# Patient Record
Sex: Male | Born: 1945 | Race: White | Hispanic: No | Smoking: Former smoker
Health system: Southern US, Community
[De-identification: ages and names within clinical notes are randomized; demographics above are authoritative.]

## PROBLEM LIST (undated history)

## (undated) DIAGNOSIS — I10 Essential (primary) hypertension: Secondary | ICD-10-CM

## (undated) DIAGNOSIS — C801 Malignant (primary) neoplasm, unspecified: Secondary | ICD-10-CM

## (undated) HISTORY — PX: LUNG TRANSPLANT: SHX234

---

## 2005-10-27 ENCOUNTER — Ambulatory Visit: Payer: Self-pay | Admitting: Internal Medicine

## 2005-10-29 ENCOUNTER — Ambulatory Visit: Payer: Self-pay | Admitting: Internal Medicine

## 2005-11-10 ENCOUNTER — Ambulatory Visit: Admission: RE | Admit: 2005-11-10 | Discharge: 2005-11-10 | Payer: Self-pay | Admitting: Internal Medicine

## 2005-11-10 ENCOUNTER — Ambulatory Visit: Payer: Self-pay | Admitting: Internal Medicine

## 2006-02-02 ENCOUNTER — Ambulatory Visit: Payer: Self-pay | Admitting: Internal Medicine

## 2006-05-26 ENCOUNTER — Ambulatory Visit: Payer: Self-pay | Admitting: Internal Medicine

## 2006-08-05 IMAGING — CT CT CHEST W/ CM
2 series · 15 of 32 positions shown, 18 images · non-contrast
Comparison: none

REASON FOR EXAM: Pneumonia
COMMENTS:

[Series 2: soft tissue · axial · 0.64mm/px · z∈[-367,-217]mm · 5 of 69 slices shown]
[im 9/69  mediastinal]
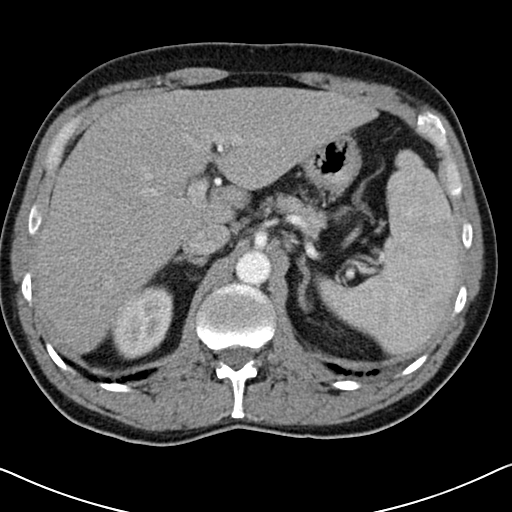
[im 18/69  mediastinal]
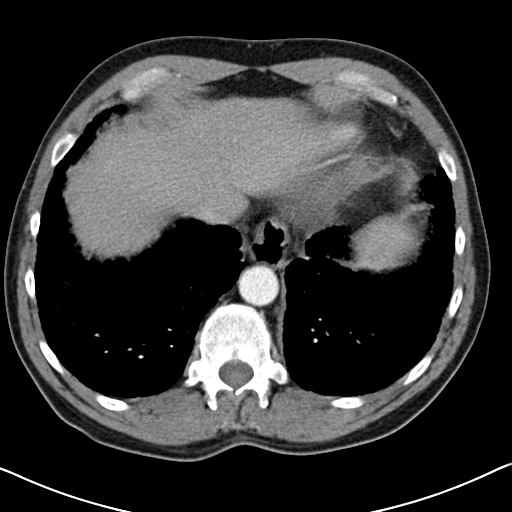
[im 23/69  mediastinal]
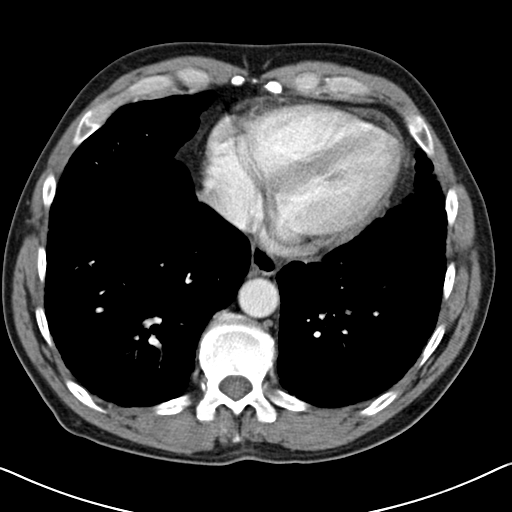
[im 30/69  mediastinal]
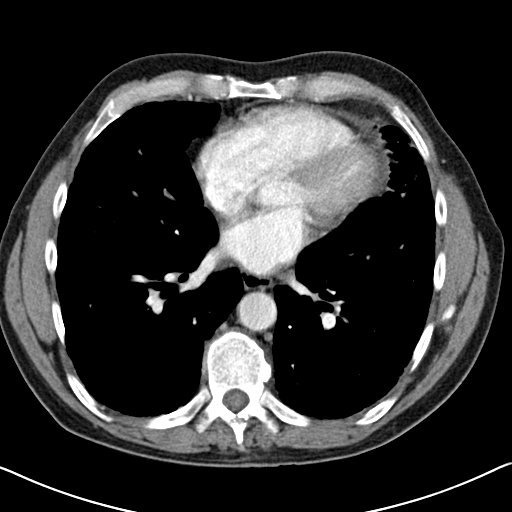
[im 39/69  mediastinal]
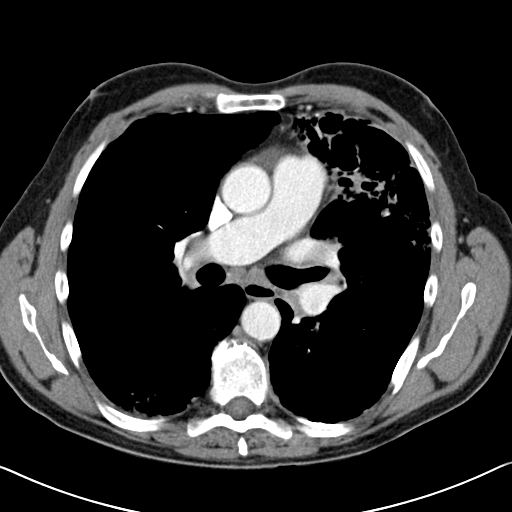

[Series 5: high res · axial · 0.64mm/px · z∈[-371,-107]mm · 10 of 43 slices shown, 13 images]
[im 5/43  mediastinal]
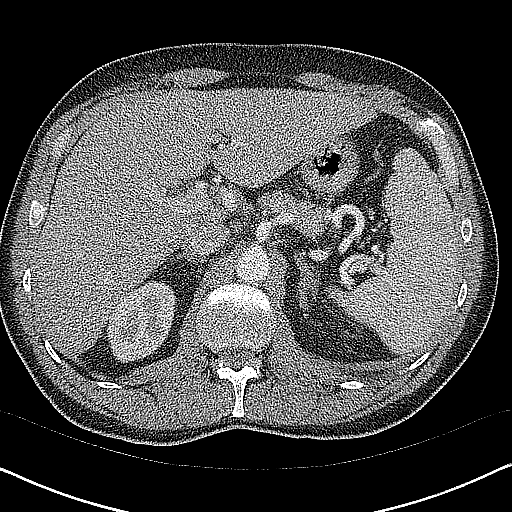
[im 5/43  lung]
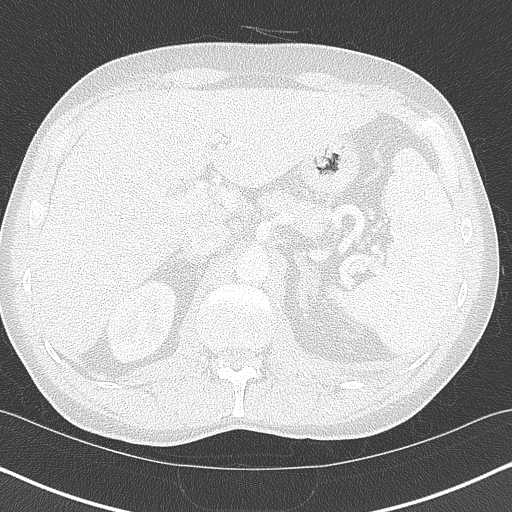
[im 9/43  lung]
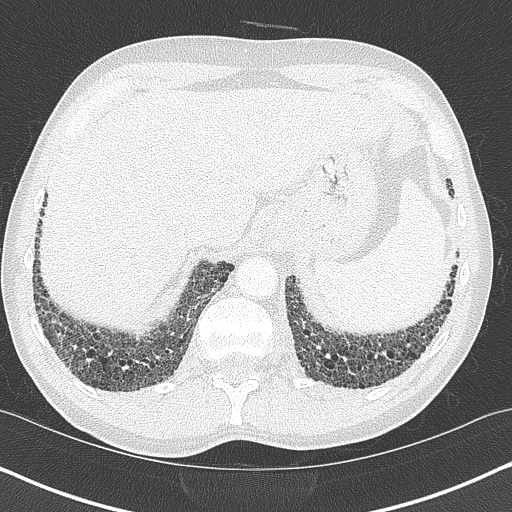
[im 13/43  lung]
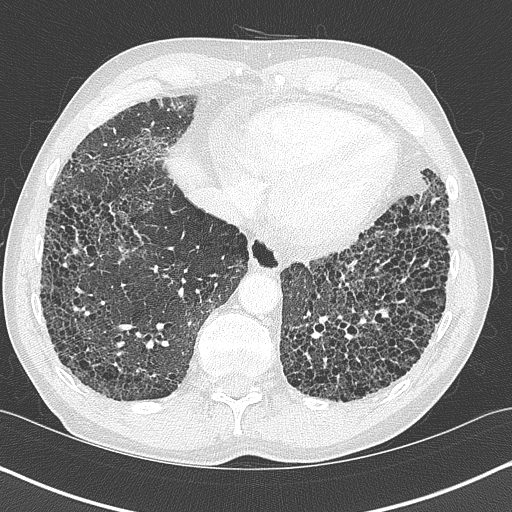
[im 17/43  lung]
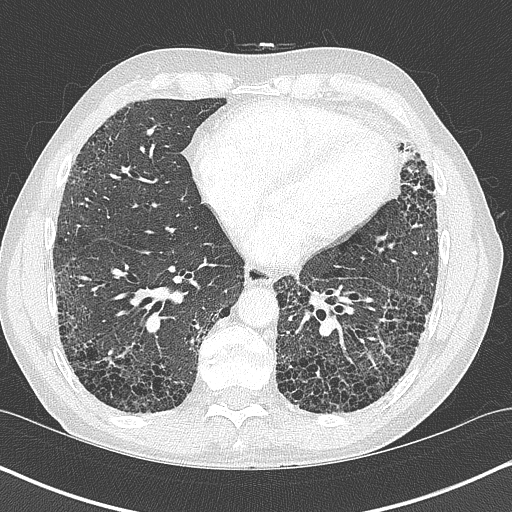
[im 21/43  mediastinal]
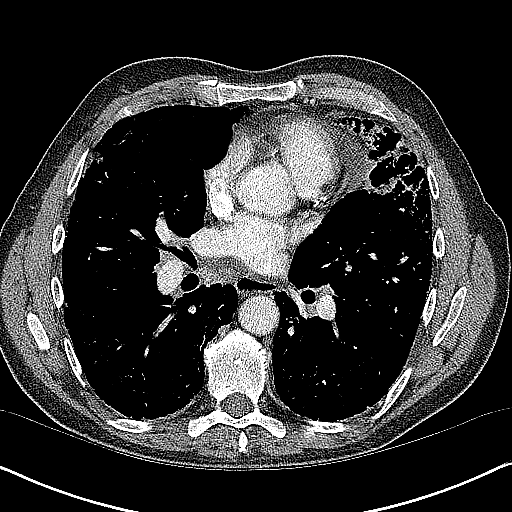
[im 21/43  lung]
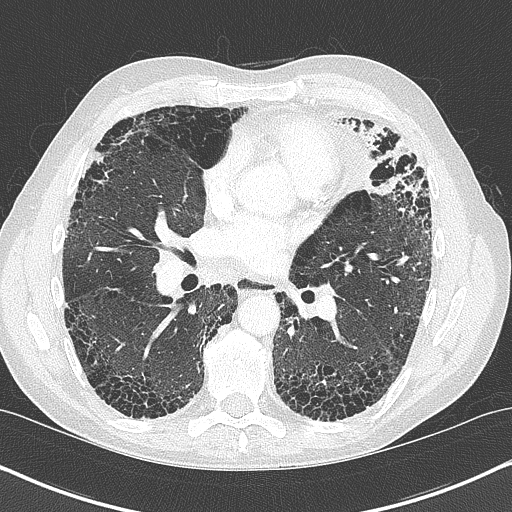
[im 22/43  lung]
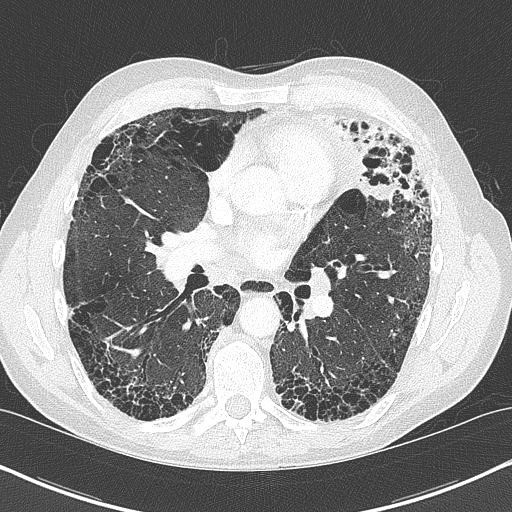
[im 26/43  lung]
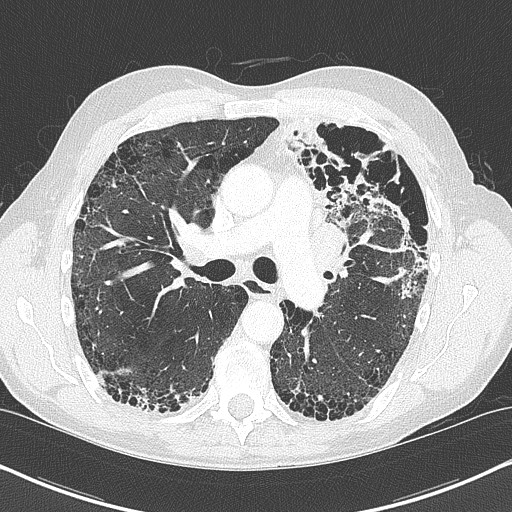
[im 30/43  lung]
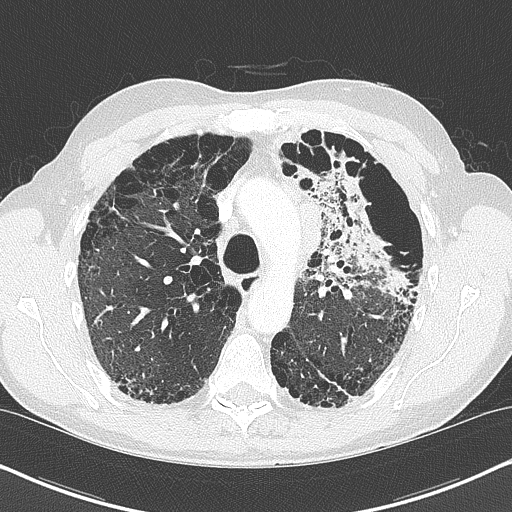
[im 34/43  mediastinal]
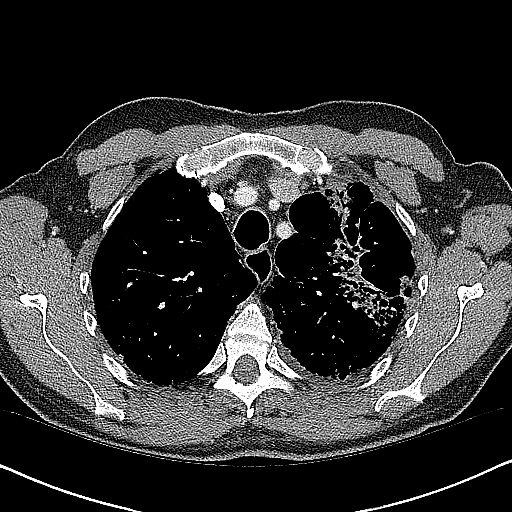
[im 34/43  lung]
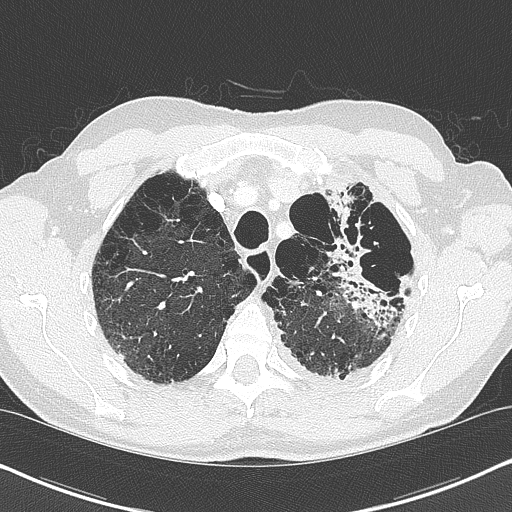
[im 38/43  lung]
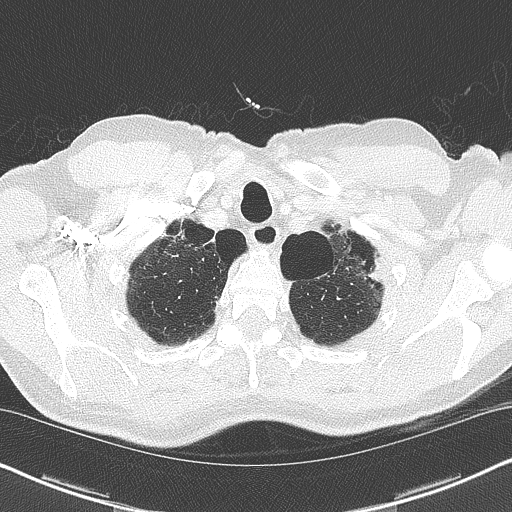

[15 of 32 positions shown; findings below may reference images not displayed]

PROCEDURE:     CT  - CT CHEST WITH CONTRAST  - October 27, 2005  [DATE]

RESULT:     The patient is being evaluated for pneumonia.

The patient received 75 ml of Ysovue-522 for this study.

At lung window settings, there is apical pleural scarring with large bullous
lesions bilaterally. In addition, increased interstitial density occupies
much of the anterior portion of the LEFT upper lobe. More inferiorly, the
LEFT upper lobe has less of the interstitial density. Subpleural
honeycombing of both lower lobes is present. The RIGHT middle lobe exhibits
mild emphysematous change. I do not see abnormal pulmonary masses. There is
some parenchymal density in a subpleural location posteriorly in the RIGHT
mid thorax.

Within the mediastinum there are enlarged lymph nodes in the AP window. The
largest of these measures approximately 3.5 cm AP x approximately 1.5 cm
transversely and has longitudinal dimension of approximately 4.0 cm. There
are smaller, adjacent nodes present. There is an enlarged hilar lymph node
on the LEFT measuring approximately 3.0 cm AP x approximately 1.8 cm
transversely x approximately 3.0 cm in superior to inferior dimension. There
is an enlarged LEFT hilar lymph node present measuring approximately 2.5 x
2.0 cm. There is no bulky subcarinal lymphadenopathy. The caliber of the
thoracic aorta is normal. The cardiac chambers are not enlarged. There is a
small, pericardial effusion. I see no significant pleural effusion. The
retrosternal soft tissues are normal in appearance. I see no axillary
lymphadenopathy.

Within the upper abdomen, the observed portions of the liver and spleen
exhibit no acute abnormality. There is a hiatal hernia present. The adrenal
glands are not enlarged.
IMPRESSION: 1.     There is extensive bullous emphysematous change of both lungs. In
addition, there is subpleural honeycombing present in both lungs especially
in the lower lobes. There is an area of confluent interstitial density in
the LEFT upper lobe which is suspicious for an active process, either
inflammatory or neoplastic. A discrete abnormal mass within the parenchyma
is not identified. There are abnormally enlarged mediastinal and hilar lymph
nodes present.
2.     There is a small, pericardial effusion but I do not see evidence of
cardiac chamber dilation. There is no pleural effusion.
3.     There is a hiatal hernia present.

## 2006-12-01 ENCOUNTER — Ambulatory Visit: Payer: Self-pay | Admitting: Internal Medicine

## 2007-01-12 ENCOUNTER — Encounter: Payer: Self-pay | Admitting: Internal Medicine

## 2007-01-20 ENCOUNTER — Ambulatory Visit: Payer: Self-pay | Admitting: Internal Medicine

## 2007-03-22 ENCOUNTER — Encounter (HOSPITAL_COMMUNITY): Admission: RE | Admit: 2007-03-22 | Discharge: 2007-06-20 | Payer: Self-pay | Admitting: Internal Medicine

## 2007-04-28 ENCOUNTER — Ambulatory Visit: Payer: Self-pay | Admitting: Internal Medicine

## 2007-05-02 ENCOUNTER — Ambulatory Visit: Payer: Self-pay | Admitting: Pulmonary Disease

## 2007-06-21 ENCOUNTER — Encounter (HOSPITAL_COMMUNITY): Admission: RE | Admit: 2007-06-21 | Discharge: 2007-09-19 | Payer: Self-pay | Admitting: Internal Medicine

## 2007-09-16 ENCOUNTER — Ambulatory Visit: Payer: Self-pay | Admitting: Internal Medicine

## 2007-09-21 ENCOUNTER — Encounter (HOSPITAL_COMMUNITY): Admission: RE | Admit: 2007-09-21 | Discharge: 2007-09-22 | Payer: Self-pay | Admitting: Internal Medicine

## 2007-11-21 ENCOUNTER — Telehealth (INDEPENDENT_AMBULATORY_CARE_PROVIDER_SITE_OTHER): Payer: Self-pay | Admitting: *Deleted

## 2007-12-16 ENCOUNTER — Telehealth (INDEPENDENT_AMBULATORY_CARE_PROVIDER_SITE_OTHER): Payer: Self-pay | Admitting: *Deleted

## 2007-12-20 ENCOUNTER — Encounter: Payer: Self-pay | Admitting: Internal Medicine

## 2008-01-10 ENCOUNTER — Telehealth: Payer: Self-pay | Admitting: Internal Medicine

## 2008-03-21 ENCOUNTER — Encounter: Payer: Self-pay | Admitting: Internal Medicine

## 2008-04-12 ENCOUNTER — Encounter: Payer: Self-pay | Admitting: Internal Medicine

## 2008-05-16 ENCOUNTER — Encounter: Payer: Self-pay | Admitting: Internal Medicine

## 2008-07-05 ENCOUNTER — Encounter: Payer: Self-pay | Admitting: Internal Medicine

## 2008-08-20 ENCOUNTER — Encounter: Payer: Self-pay | Admitting: Internal Medicine

## 2009-09-03 ENCOUNTER — Encounter: Payer: Self-pay | Admitting: Internal Medicine

## 2010-09-08 ENCOUNTER — Encounter: Payer: Self-pay | Admitting: Internal Medicine

## 2011-01-20 NOTE — Letter (Signed)
Summary: Transplant Return Visit/[facility]  Transplant Return Visit/[facility]   Imported By: Sherian Rein 09/25/2010 09:59:20  _____________________________________________________________________  External Attachment:    Type:   Image     Comment:   External Document

## 2011-02-16 ENCOUNTER — Encounter: Payer: Self-pay | Admitting: Internal Medicine

## 2011-03-10 NOTE — Letter (Signed)
Summary: Transplant visit/[facility]  Transplant visit/[facility]   Imported By: Lester Superior 03/03/2011 08:26:04  _____________________________________________________________________  External Attachment:    Type:   Image     Comment:   External Document

## 2011-05-05 NOTE — Assessment & Plan Note (Signed)
[name]                             PULMONARY OFFICE NOTE   Billy Mckenzie, Billy Mckenzie                      MRN:          [id number]  DATE:05/02/2007                            DOB:          12/31/1945    HISTORY OF PRESENT ILLNESS:  The patient is a 65 year old white male  patient of Dr.  Thurston Hole who has a known history of chronic hypoxic  respiratory failure secondary to COPD and pulmonary fibrosis that  presents today for an acute office visit. The patient complains over the  past week he has had waxing and waning chills and fever with slight  increase in cough and congestion. The patient complains that he has had  progressively worsening shortness of breath with minimum activity. He  denies any hemoptysis, orthopnea, PND or leg swelling.   PAST MEDICAL HISTORY:  Reviewed.   CURRENT MEDICATIONS:  Reviewed.   PHYSICAL EXAMINATION:  The patient is a pleasant, thin, white male in no  acute distress. He is afebrile with stable vital signs. O2 saturation is  95% on 6 liters.  HEENT: Is unremarkable.  NECK: Supple without cervical adenopathy. No JVD.  LUNGS: Lung sounds revealed diminished breath sounds in the bases  without any wheezing, crackles.  CARDIAC: Regular rate.  ABDOMEN: Soft and nontender.  EXTREMITIES: Warm without any calf cyanosis, clubbing or edema.   IMPRESSION/PLAN:  Mild chronic obstructive pulmonary disease flare. The  patient is to begin doxycycline 100 mg b.i.d. x7 days. Add in Mucinex  twice daily. The patient will return back with Dr.  Sherene Sires as scheduled or  sooner if needed.      Rubye Oaks, NP       [name]. Sherene Sires, MD, [facility]    TP/MedQ  DD: 05/02/2007  DT: 05/02/2007  Job #: [id number]

## 2011-05-05 NOTE — Assessment & Plan Note (Signed)
[facility]                             PULMONARY OFFICE NOTE   LONZA, SHIMABUKURO                      MRN:          [id number]  DATE:09/16/2007                            DOB:          [birth year]    HISTORY:  This is a 65 year old white male with documented pulmonary  fibrosis associated with honeycombing dating back to at least November  2006 who is now oxygen dependent and no longer able to work. He is being  followed at [facility], but comes in today acutely for increasing nasal  congestion associated with dyspnea while on oxygen up to 6 liters per  minute. He does have minimum cough productive of thick mucus and nasal  congestion, but no fevers, chills, sweats, orthopnea, PND, purulent  sputum, leg swelling, or chest pain.   His medications at this point consist of:  1. Spiriva 1 daily.  2. Aciphex 200 mg q.a.m.   He has found on his own that using Afrin helps transiently.   PHYSICAL EXAMINATION:  GENERAL:  He has a mild nasal tone to his voice.  VITAL SIGNS:  Stable.  HEENT:  Remarkable for moderate non-specific turbinate edema, oropharynx  is clear.  NECK:  Supple without cervical adenopathy or tenderness. Trachea is  midline.  LUNGS:  Fields reveal coarse inspiratory crackles in the bases only.  There is no expiratory wheeze or cough on inspiration.  CARDIAC:  Regular rate and rhythm with no murmurs, rubs, or gallops. No  increase in P2.  ABDOMEN:  Soft and benign.  EXTREMITIES:  Warm without calf tenderness, cyanosis or edema. There is  moderate clubbing.   Hemoglobin saturation is at 95% on 6 liters.   IMPRESSION:  Chronic rhinitis has developed I believe due to the  irritation of using nasal oxygen and maybe blocking the administration  of oxygen in this chronically O2 dependent patient. I have recommended a  trial of Nasonex in combination with Afrin, and I spent extra time  reviewing a chronic rhinitis flyer to him emphasizing  that if the  nasal inflammation is not addressed, he will not be able to get the full  benefit of wearing oxygen, which is really all we have to offer given  the severity of the honeycomb fibrosis that he is experiencing.   I reviewed all of these issues with him in detail in writing and  referred him back to [facility] for further follow up.     [name]. Sherene Sires, MD, [facility]  Electronically Signed    MBW/MedQ  DD: 09/16/2007  DT: 09/17/2007  Job #: [id number]   cc:   Daniel Nones, MD

## 2011-05-05 NOTE — Assessment & Plan Note (Signed)
[facility]                             PULMONARY OFFICE NOTE   Billy Mckenzie, Billy Mckenzie                      MRN:          [id number]  DATE:04/28/2007                            DOB:          26-Jan-1946    PRIMARY PULMONARY/EXTENDED FOLLOWUP OFFICE VISIT:   HISTORY:  [age]-year-old white male with severe pulmonary  fibrosis, now being considered for a lung transplantation at [facility] and  with chronic hypoxemic respiratory failure, which appears  disproportionate to lung volume and has left him on oxygen, 3 L at rest  and 6 L with activity.  He states that on 6 L he can walk 1.7 miles an  hour for 30 minutes.  There is concern that he desaturates below 89% and  whether or not he should be using a face mask; however, the patient has  no symptoms exertional chest pain, chest tightness, diaphoresis, nausea  or altered mentation related to pushing himself to saturations as low as  77% (although he tells me they typically stop him when he gets below  88%).   He did have 1 episode of a chill a week ago, but no purulent sputum,  pleuritic pain, fevers, chills sweats, orthopnea, PND or leg swelling.   MEDICATIONS:  His only medications at this point are:  1. Spiriva 1 daily.  2. AcipHex 20 mg one q.a.m.   PHYSICAL EXAMINATION:  He is a pleasant, ambulatory white male in no  acute distress.  He has stable vital signs.  HEENT:  Unremarkable.  Oropharynx clear.  NECK:  Supple without cervical adenopathy or tenderness.  Trachea is  midline with no thyromegaly.  LUNGS:  Fields reveal decreased breath sounds bilaterally with no  wheezing.  HEART:  Regular rhythm without murmur, gallop or rub.  ABDOMEN:  Soft and benign.  EXTREMITIES:  Warm without calf tenderness, cyanosis or edema.  There  was moderate clubbing.   He walked from our lobby into the exam room on 3 L (note -- he is  supposed to be on 6 L when he walks) and desaturated to 77%, but felt  fine.  At rest, he increased back up to 90%.   IMPRESSION:  Severe hypoxic respiratory failure secondary to pulmonary  fibrosis.  He really does not have much in terms of COPD, but  benefitted clinically from Spiriva, which I have asked him to continue.   Further, because of the association between fibrosis and reflux  (although not necessarily cause and effect), I think it is prudent to  continue AcipHex before breakfast daily.   He had a chill last week, but nothing to suggest ongoing  tracheobronchitis or infection of any kind; therefore, I gave him some  symptoms to be on the lookout for that would indicate infection.  He is  susceptible to infection, but also would definitely likely decompensate  with viral infection and/or is subject to acute exacerbation of  interstitial lung disease which is often indistinguishable from other  causes of respiratory decompensation including viral infections.   We had a long and philosophical discussion about the issues related to  rehabilitation  and transplant.  I strongly would recommend this patient  proceed to transplant, based on the severity of his disease, and would  avoid using anti-inflammatories here because he really does not have  much to indicate an inflammatory process.   Finally, in terms of how much oxygen he needs with exercise, I think it  is reasonable if he feels fine on 6 L to continue to exercise at 6 L.  I  would be concerned if he drops below 85% consistently and only at that  point would I recommend using a face mask, and only with oxygen.   I will see the patient back here on an as-needed basis.  He has regular  followup at [facility] with Dr. Christin Fudge.     [name]. Sherene Sires, MD, [facility]  Electronically Signed    MBW/MedQ  DD: 04/28/2007  DT: 04/28/2007  Job #: [id number]   cc:   Almon Register, [place], [place] Daniel Nones MD  [facility] Mike Craze MD

## 2011-05-08 NOTE — Assessment & Plan Note (Signed)
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  He returns today much worse over the  last 5 days with increasing sensation of cough and congestion after  developing a shaking chill on January 27.  He denies any purulent  sputum, however, sore throat, recurrent fever, pruritic pain.  He is  having more dyspnea than usual, however.   He is maintaining on Spiriva 1 daily and AcipHex q. a.m. empirically.   PHYSICAL EXAMINATION:  He is a robust, pleasant, ambulatory white male  in no acute distress.  VITAL SIGNS:  Stable vital signs.  HEENT:  Unremarkable.  Oropharynx is clear.  LUNGS:  Fields reveal minimal crackles at the bases only.  CARDIAC:  Regular rate and rhythm without murmur, gallop, or rub.  ABDOMEN:  Soft and benign.  EXTREMITIES:  Warm without calf tenderness, cyanosis, or edema, though  there is moderate to severe clubbing.  NEUROLOGIC:  No focal deficits.   Hemoglobin saturation is only 73% on room air, but on 4L increased to  the high 90s.  On 2L, he was in the low 90s at rest.   IMPRESSION:  Acute on chronic hypoxemic respiratory failure, much worse  after apparent upper respiratory infection.  I note that he received  Pneumovax in 2004, but did not receive a flu vaccination this fall.  However, he has had no recurrent fever, myalgias, or arthralgias to  suggest an ongoing flu syndrome, and I suspect that this was viral but  not influenza.   RECOMMENDATIONS:  Extended discussion with this patient lasting 15-20  minutes of a 25 minute visit on the following topics.  1. Initiate Doxycycline 100 mg b.i.d. for 10 days empirically.  2. Oxygen at 2L 24 hours a day.  3. Continue AcipHex 20 mg before meals daily, not because I think      reflux is causing the problem, but may be a secondary phenomenon      given the severity of the fibrosis.  4. Also continue Spiriva once daily for possible element of air flow      obstruction, although I do not think this is a major issue either.      I went over each and every 1 of these issues with the patient,      including the need to consider going on disability.  He has at      least agreed to take some time off and see if he can return to work      on February 4 as his job as  a Psychologist, sport and exercise, and promises to try to wear portable oxygen (though I did      not get a firm commitment in this regard today).     [name]. Sherene Sires, MD, [facility]  Electronically Signed    MBW/MedQ  DD: 01/20/2007  DT: 01/20/2007  Job #: [id number]   cc:   Ronney Lion, Dr

## 2011-05-08 NOTE — Letter (Signed)
November 10, 2007     RE:  KANOA, PHILLIPPI  MRN:  [id number]  /  DOB:  1946-11-18   To Whom It May Concern:   Mr. Shemar Plemmons has been a patient of mine since October 29, 2005.  He suffers from progressive pulmonary fibrosis to the point of resting  hypoxemic respiratory failure and is severely debilitated and oxygen  dependent.  On this basis, since January 20, 2007, he has not been able  to work and has been under the care as well of the Pulmonary Division at  [facility].    Sincerely,      Casimiro Needle B. Sherene Sires, MD, [facility]  Electronically Signed    MBW/MedQ  DD: 11/10/2007  DT: 11/10/2007  Job #: [id number]

## 2011-05-08 NOTE — Assessment & Plan Note (Signed)
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  He was first diagnosed by Dr. Deboraha Sprang in  2004, and continues to have minimum symptoms.  He is able to ambulate  all day long on the floor at [facility] without difficulty,  walking up to 10 miles a day.  He says he is making more money than he  ever has, and really does not want to consider stopping.  He returns as  requested for followup, having had significant benefit in his main  complaint, cough, taking Aciphex daily and also observing a reflux diet.  He also has been maintained on Spiriva.   PHYSICAL EXAMINATION:  He is a pleasant ambulatory white male in no  acute distress.  He is afebrile, stable vital signs.  HEENT:  Unremarkable, oropharynx is clear.  Lung fields classic crackles in the bases only.  Breath sounds are  slightly coarse.  There is no wheezing.  CARDIAC:  There is a regular rhythm without murmur, gallop or rub.  ABDOMEN:  Soft, benign.  EXTREMITIES:  Warm without calf tenderness, cyanosis or edema.  There is  severe clubbing bilaterally.   Hemoglobin saturation is 83% on room air.   Chest x-ray reveals coarse fibrotic change bilaterally with relatively  preserved lung volumes.  PFTs reveal an FVC of 85% predicted, but a  diffusion capacity of only 20%.   IMPRESSION:  Extended discussion with this patient lasting 15 to 20  minutes of a 25 minute visit on the following topics:   Classic pulmonary fibrosis.  The patient tells me he was diagnosed with  rheumatism by Dr. Gavin Potters years ago, and refused to take therapy.  However, he has had no  significant myalgias or arthralgias and I suspect  that these were false positives in a patient who has usual interstitial  pneumonia, which is reported to show abnormal serology for rheumatologic  diseases in up to 30% of cases.   In any case, regardless of the mechanism driving the fibrosis, he has  reached end-stage where he is hypoxemic at rest, and needs to be  considered for lung transplant surgery.  I have recommended that he be  referred now, although he is strongly reluctant to do this, especially  since he feels better from the combination of Spiriva and Aciphex,  which was started in this office.   I spent extra time with this patient going over the natural history of  the disease and the complications, which include polycythemia with risk  of stroke and heart disease, and the fact that anti-inflammatories were  unlikely to be of any benefit unless there is significant elevation of  sed rate (which was drawn along with repeat serologies for rheumatologic  disease today).  Once he does establish with [facility], his followup here can be p.r.n.  The  patient declined again today to be considered for ambulatory oxygen.     Billy Mckenzie. Sherene Sires, MD, [facility]  Electronically Signed    MBW/MedQ  DD: 12/01/2006  DT: 12/02/2006  Job #: [id number]   cc:   Daniel Nones, MD

## 2016-08-01 ENCOUNTER — Emergency Department
Admission: EM | Admit: 2016-08-01 | Discharge: 2016-08-01 | Disposition: A | Discharge status: Home / Self Care | Payer: Medicare Other | Attending: Student in an Organized Health Care Education/Training Program | Admitting: Student in an Organized Health Care Education/Training Program

## 2016-08-01 ENCOUNTER — Encounter: Payer: Self-pay | Admitting: Emergency Medicine

## 2016-08-01 DIAGNOSIS — Y999 Unspecified external cause status: Secondary | ICD-10-CM | POA: Diagnosis not present

## 2016-08-01 DIAGNOSIS — S51811A Laceration without foreign body of right forearm, initial encounter: Secondary | ICD-10-CM | POA: Insufficient documentation

## 2016-08-01 DIAGNOSIS — I10 Essential (primary) hypertension: Secondary | ICD-10-CM | POA: Insufficient documentation

## 2016-08-01 DIAGNOSIS — Z87891 Personal history of nicotine dependence: Secondary | ICD-10-CM | POA: Insufficient documentation

## 2016-08-01 DIAGNOSIS — Y9389 Activity, other specified: Secondary | ICD-10-CM | POA: Diagnosis not present

## 2016-08-01 DIAGNOSIS — Y929 Unspecified place or not applicable: Secondary | ICD-10-CM | POA: Insufficient documentation

## 2016-08-01 DIAGNOSIS — W268XXA Contact with other sharp object(s), not elsewhere classified, initial encounter: Secondary | ICD-10-CM | POA: Insufficient documentation

## 2016-08-01 HISTORY — DX: Essential (primary) hypertension: I10

## 2016-08-01 MED ORDER — LIDOCAINE-EPINEPHRINE (PF) 1 %-1:200000 IJ SOLN
10.0000 mL | Freq: Once | INTRAMUSCULAR | Status: AC
  Administered 2016-08-01: 10 mL
  Filled 2016-08-01: qty 30

## 2016-08-01 MED ORDER — AMOXICILLIN-POT CLAVULANATE 875-125 MG PO TABS: 1.0000 | ORAL_TABLET | Freq: Two times a day (BID) | ORAL | 0 refills | Status: DC

## 2016-08-01 NOTE — ED Provider Notes (Signed)
[facility] Emergency Department Provider Note  ____________________________________________  Time seen: Approximately 5:35 PM  I have reviewed the triage vital signs and the nursing notes.   HISTORY  Chief Complaint Laceration    HPI Billy Mckenzie is a 70 y.o. male who presents emergency department complaining of a laceration to his right forearm. Patient was using a table saw when the wooden table like he was cutting "bounced back" and caught him in the forearm. Patient reports that the would "pealed" the skin up. Patient reports that wouldn't in that struck him was smooth, polished, with no splinters. Patient immediately cleansed the wound, put a pressure dressing on it with good cessation of bleeding. Patient reports good range of motion to elbow, wrist, and all digits right hand. He denies any other injuries. 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   Past Medical History:  Diagnosis Date  . Hypertension     There are no active problems to display for this patient.   Past Surgical History:  Procedure Laterality Date  . LUNG TRANSPLANT      Prior to Admission medications   Medication Sig Start Date End Date Taking? Authorizing Provider  amoxicillin-clavulanate (AUGMENTIN) 875-125 MG tablet Take 1 tablet by mouth 2 (two) times daily. 08/01/16   Charline Bills Alishah Schulte, PA-C    Allergies Nsaids  No family history on file.  Social History Social History  Substance Use Topics  . Smoking status: Former Research scientist (life sciences)  . Smokeless tobacco: Not on file  . Alcohol use No     Review of Systems  Constitutional: No fever/chills Eyes: No visual changes.  Cardiovascular: no chest pain. Respiratory: no cough. No SOB. Musculoskeletal: Negative for musculoskeletal pain. Skin: Positive for laceration to the right forearm Neurological: Negative for headaches, focal weakness or numbness. 10-point ROS otherwise  negative.  ____________________________________________   PHYSICAL EXAM:  VITAL SIGNS: ED Triage Vitals  Enc Vitals Group     BP 08/01/16 1650 (!) 147/86     Pulse Rate 08/01/16 1650 87     Resp 08/01/16 1650 18     Temp 08/01/16 1650 97.9 F (36.6 C)     Temp Source 08/01/16 1650 Oral     SpO2 08/01/16 1650 98 %     Weight 08/01/16 1651 217 lb (98.4 kg)     Height 08/01/16 1651 5\' 11"  (1.803 m)     Head Circumference --      Peak Flow --      Pain Score 08/01/16 1652 2     Pain Loc --      Pain Edu? --      Excl. in [place]? --      Constitutional: Alert and oriented. Well appearing and in no acute distress. Eyes: Conjunctivae are normal. PERRL. EOMI. Head: Atraumatic. Cardiovascular: Normal rate, regular rhythm. Normal S1 and S2.  Good peripheral circulation. Respiratory: Normal respiratory effort without tachypnea or retractions. Lungs CTAB. Good air entry to the bases with no decreased or absent breath sounds. Musculoskeletal: Full range of motion to all extremities. No gross deformities appreciated. Neurologic:  Normal speech and language. No gross focal neurologic deficits are appreciated.  Skin:  Skin is warm, dry and intact. No rash noted. Flap laceration is noted to the right anterior forearm. Lacerated edge measures approximately 10 cm in length. Visualization of the entire wound surface is obtained by lifting flat. No visible foreign body. Bleeding is controlled at this time. Flexor tendon is  visible upon inspection. Patient has full range of motion to wrist and all digits right hand. No visible injury to tendon. Psychiatric: Mood and affect are normal. Speech and behavior are normal. Patient exhibits appropriate insight and judgement.   ____________________________________________   LABS (all labs ordered are listed, but only abnormal results are displayed)  Labs Reviewed - No data to  display ____________________________________________  EKG   ____________________________________________  RADIOLOGY   No results found.  ____________________________________________    PROCEDURES  Procedure(s) performed:    Marland KitchenMarland KitchenLaceration Repair Date/Time: 08/01/2016 6:51 PM Performed by: Betha Loa D Authorized by: Betha Loa D   Consent:    Consent obtained:  Verbal   Consent given by:  Patient   Risks discussed:  Pain, tendon damage and poor wound healing   Alternatives discussed:  Referral and delayed treatment Anesthesia (see MAR for exact dosages):    Anesthesia method:  Local infiltration   Local anesthetic:  Lidocaine 1% WITH epi (1ml) Laceration details:    Location:  Shoulder/arm   Shoulder/arm location:  R lower arm   Length (cm):  10 Repair type:    Repair type:  Intermediate Pre-procedure details:    Preparation:  Patient was prepped and draped in usual sterile fashion Exploration:    Hemostasis achieved with:  Epinephrine and direct pressure   Wound exploration: wound explored through full range of motion and entire depth of wound probed and visualized     Wound extent: no foreign bodies/material noted, no muscle damage noted and no tendon damage noted     Contaminated: no   Treatment:    Area cleansed with:  Betadine   Amount of cleaning:  Extensive   Irrigation solution:  Sterile saline   Irrigation volume:  1L   Irrigation method:  Syringe Skin repair:    Repair method:  Sutures   Suture size:  4-0   Suture material:  Nylon   Suture technique: 10 simple interrupted, 3 horizontal mattress.   Number of sutures:  13 Approximation:    Approximation:  Close   [place] border: well-aligned   Post-procedure details:    Dressing:  Non-adherent dressing, sterile dressing and tube gauze   Patient tolerance of procedure:  Tolerated well, no immediate complications      Medications  lidocaine-EPINEPHrine  (XYLOCAINE-EPINEPHrine) 1 %-1:200000 (PF) injection 10 mL (not administered)     ____________________________________________   INITIAL IMPRESSION / ASSESSMENT AND PLAN / ED COURSE  Pertinent labs & imaging results that were available during my care of the patient were reviewed by me and considered in my medical decision making (see chart for details).  Clinical Course    Patient's diagnosis is consistent with A laceration to the right forearm. This requires extensive repair which is accomplished as described above. Patient tolerated well. Entire laceration was well visualized and no imaging was obtained for foreign body. Due to nature of wound, patient will follow-up in 2 days for recheck.. Patient will be discharged home with prescriptions for Augmentin due to exposed tendon during injury..  Patient is given ED precautions to return to the ED for any worsening or new symptoms.     ____________________________________________  FINAL CLINICAL IMPRESSION(S) / ED DIAGNOSES  Final diagnoses:  Forearm laceration, right, initial encounter      NEW MEDICATIONS STARTED DURING THIS VISIT:  New Prescriptions   AMOXICILLIN-CLAVULANATE (AUGMENTIN) 875-125 MG TABLET    Take 1 tablet by mouth 2 (two) times daily.        This chart  was dictated using voice recognition software/Dragon. Despite best efforts to proofread, errors can occur which can change the meaning. Any change was purely unintentional.    Darletta Moll, PA-C 08/01/16 1921    Merlyn Lot, MD 08/01/16 1958

## 2016-08-01 NOTE — ED Triage Notes (Signed)
States was cutting wood and wood kicked back and cut R forearm. Dressing over at this time with no bleed through so not removed at triage.

## 2016-08-03 ENCOUNTER — Emergency Department
Admission: EM | Admit: 2016-08-03 | Discharge: 2016-08-03 | Disposition: A | Discharge status: Home / Self Care | Payer: Medicare Other | Attending: Emergency Medicine | Admitting: Emergency Medicine

## 2016-08-03 ENCOUNTER — Encounter: Payer: Self-pay | Admitting: Medical Oncology

## 2016-08-03 DIAGNOSIS — I10 Essential (primary) hypertension: Secondary | ICD-10-CM | POA: Insufficient documentation

## 2016-08-03 DIAGNOSIS — Z4801 Encounter for change or removal of surgical wound dressing: Secondary | ICD-10-CM | POA: Diagnosis not present

## 2016-08-03 DIAGNOSIS — Z5189 Encounter for other specified aftercare: Secondary | ICD-10-CM

## 2016-08-03 DIAGNOSIS — Z87891 Personal history of nicotine dependence: Secondary | ICD-10-CM | POA: Insufficient documentation

## 2016-08-03 NOTE — ED Notes (Signed)
See triage note  Here for wound check  Area is intact   Bruising noted at post wrist

## 2016-08-03 NOTE — ED Provider Notes (Signed)
[facility] Emergency Department Provider Note  ____________________________________________  Time seen: Approximately 6:19 PM  I have reviewed the triage vital signs and the nursing notes.   HISTORY  Chief Complaint Wound Check    HPI Billy Mckenzie is a 70 y.o. male who presents emergency department for wound recheck to his right forearm. Patient was seen by myself in this department 2 days prior. See previous note for details. Patient returns for wound recheck for his laceration. Patient denies any increased pain, swelling, redness. He reports visualizing the wound at home and appears "to be healing well."   Past Medical History:  Diagnosis Date  . Hypertension     There are no active problems to display for this patient.   Past Surgical History:  Procedure Laterality Date  . LUNG TRANSPLANT      Prior to Admission medications   Medication Sig Start Date End Date Taking? Authorizing Provider  amoxicillin-clavulanate (AUGMENTIN) 875-125 MG tablet Take 1 tablet by mouth 2 (two) times daily. 08/01/16   Charline Bills Makyah Lavigne, PA-C    Allergies Nsaids  No family history on file.  Social History Social History  Substance Use Topics  . Smoking status: Former Research scientist (life sciences)  . Smokeless tobacco: Not on file  . Alcohol use No     Review of Systems  Constitutional: No fever/chills hCardiovascular: no chest pain. Respiratory: no cough. No SOB. Gastrointestinal: No abdominal pain.  No nausea, no vomiting.   Musculoskeletal: Negative for musculoskeletal pain. Skin positive for suture laceration to the right forearm Neurological: Negative for headaches, focal weakness or numbness. 10-point ROS otherwise negative.  ____________________________________________   PHYSICAL EXAM:  VITAL SIGNS: ED Triage Vitals  Enc Vitals Group     BP 08/03/16 1730 122/81     Pulse Rate 08/03/16 1730 87     Resp 08/03/16 1730 18     Temp 08/03/16 1730 97.8 F  (36.6 C)     Temp Source 08/03/16 1730 Oral     SpO2 08/03/16 1730 99 %     Weight 08/03/16 1730 210 lb (95.3 kg)     Height 08/03/16 1730 5\' 11"  (1.803 m)     Head Circumference --      Peak Flow --      Pain Score 08/03/16 1733 0     Pain Loc --      Pain Edu? --      Excl. in [place]? --      Constitutional: Alert and oriented. Well appearing and in no acute distress. Eyes: Conjunctivae are normal. PERRL. EOMI. Head: Atraumatic. Cardiovascular: Normal rate, regular rhythm. Normal S1 and S2.  Good peripheral circulation. Respiratory: Normal respiratory effort without tachypnea or retractions. Lungs CTAB. Good air entry to the bases with no decreased or absent breath sounds. Musculoskeletal: Full range of motion to all extremities. No gross deformities appreciated. Neurologic:  Normal speech and language. No gross focal neurologic deficits are appreciated.  Skin:  Skin is warm, dry and intact. No rash noted.Sutured laceration to the right anterior forearm is identified. No dehiscence. No surrounding erythema or edema. No pustular drainage from site. Radial pulse intact. Sensation and cap refill intact distally. Psychiatric: Mood and affect are normal. Speech and behavior are normal. Patient exhibits appropriate insight and judgement.   ____________________________________________   LABS (all labs ordered are listed, but only abnormal results are displayed)  Labs Reviewed - No data to display ____________________________________________  EKG   ____________________________________________  RADIOLOGY   No results  found.  ____________________________________________    PROCEDURES  Procedure(s) performed:    Procedures    Medications - No data to display   ____________________________________________   INITIAL IMPRESSION / ASSESSMENT AND PLAN / ED COURSE  Pertinent labs & imaging results that were available during my care of the patient were reviewed by me and  considered in my medical decision making (see chart for details).  Clinical Course    Patient's diagnosis is consistent with Encounter for wound recheck. Area is healing appropriately with no signs of infection. Continued wound care structures are given to patient. Patient will have sutures removed in 7 days from today..  Patient is given ED precautions to return to the ED for any worsening or new symptoms.     ____________________________________________  FINAL CLINICAL IMPRESSION(S) / ED DIAGNOSES  Final diagnoses:  Visit for wound check      NEW MEDICATIONS STARTED DURING THIS VISIT:  New Prescriptions   No medications on file        This chart was dictated using voice recognition software/Dragon. Despite best efforts to proofread, errors can occur which can change the meaning. Any change was purely unintentional.    Darletta Moll, PA-C 08/03/16 1825    Harvest Dark, MD 08/03/16 [id number]

## 2016-08-03 NOTE — ED Triage Notes (Signed)
Pt here for wound check- here for stitches 2 days ago.

## 2016-08-10 ENCOUNTER — Emergency Department
Admission: EM | Admit: 2016-08-10 | Discharge: 2016-08-10 | Disposition: A | Discharge status: Home / Self Care | Payer: Medicare Other | Attending: Student | Admitting: Student

## 2016-08-10 DIAGNOSIS — I10 Essential (primary) hypertension: Secondary | ICD-10-CM | POA: Insufficient documentation

## 2016-08-10 DIAGNOSIS — Z4802 Encounter for removal of sutures: Secondary | ICD-10-CM | POA: Diagnosis present

## 2016-08-10 DIAGNOSIS — Z87891 Personal history of nicotine dependence: Secondary | ICD-10-CM | POA: Insufficient documentation

## 2016-08-10 NOTE — ED Triage Notes (Signed)
Pt is here for suture removal from the right forearm.

## 2016-08-10 NOTE — ED Provider Notes (Signed)
[facility] Emergency Department Provider Note  ____________________________________________  Time seen: Approximately 4:59 PM  I have reviewed the triage vital signs and the nursing notes.   HISTORY  Chief Complaint Suture / Staple Removal    HPI Billy Mckenzie is a 70 y.o. male who presents emergency department for suture removal. Patient was seen by myselfon 08/01/2016 for a laceration to the right forearm. This was extensive in nature with exposed tendon. Patient's exam was reassuring and he followed up with myself 2 days later for recheck. Patient was placed on Augmentin to prevent infection. Patient returns for final check and suture removal. He denies any signs of infection with increased erythema, edema, pain, or drainage. Patient reports the area has healed well. Patient reports good range of motion and sensation distal to injury site.   Past Medical History:  Diagnosis Date  . Hypertension     There are no active problems to display for this patient.   Past Surgical History:  Procedure Laterality Date  . LUNG TRANSPLANT      Prior to Admission medications   Not on File    Allergies Nsaids  No family history on file.  Social History Social History  Substance Use Topics  . Smoking status: Former Research scientist (life sciences)  . Smokeless tobacco: Never Used  . Alcohol use No     Review of Systems  Constitutional: No fever/chills Cardiovascular: no chest pain. Respiratory: no cough. No SOB. Musculoskeletal: Negative for musculoskeletal pain. Skin: Positive for suture laceration to the right forearm. Neurological: Negative for headaches, focal weakness or numbness. 10-point ROS otherwise negative.  ____________________________________________   PHYSICAL EXAM:  VITAL SIGNS: ED Triage Vitals [08/10/16 1653]  Enc Vitals Group     BP      Pulse      Resp      Temp      Temp src      SpO2      Weight 210 lb (95.3 kg)     Height 5\' 11"   (1.803 m)     Head Circumference      Peak Flow      Pain Score 0     Pain Loc      Pain Edu?      Excl. in [place]?      Constitutional: Alert and oriented. Well appearing and in no acute distress. Eyes: Conjunctivae are normal. PERRL. EOMI. Head: Atraumatic. Cardiovascular: Normal rate, regular rhythm. Normal S1 and S2.  Good peripheral circulation. Respiratory: Normal respiratory effort without tachypnea or retractions. Lungs CTAB. Good air entry to the bases with no decreased or absent breath sounds. Musculoskeletal: Full range of motion to all extremities. No gross deformities appreciated. Neurologic:  Normal speech and language. No gross focal neurologic deficits are appreciated.  Skin:  Skin is warm, dry and intact. No rash noted. Sutured laceration is noted to the right forearm. No surrounding erythema or edema. Area appears to be healing well. No dehiscence. 13 sutures are in place. Psychiatric: Mood and affect are normal. Speech and behavior are normal. Patient exhibits appropriate insight and judgement.   ____________________________________________   LABS (all labs ordered are listed, but only abnormal results are displayed)  Labs Reviewed - No data to display ____________________________________________  EKG   ____________________________________________  RADIOLOGY   No results found.  ____________________________________________    PROCEDURES  Procedure(s) performed:    Procedures  SUTURE REMOVAL Performed by: Darletta Moll  Consent: Verbal consent obtained. Patient identity confirmed: provided  demographic data Time out: Immediately prior to procedure a "time out" was called to verify the correct patient, procedure, equipment, support staff and site/side marked as required.  Location details: Right anterior forearm  Wound Appearance: clean  Sutures/Staples Removed: 13   Facility: sutures placed in this facility Patient tolerance: Patient  tolerated the procedure well with no immediate complications.     Medications - No data to display   ____________________________________________   INITIAL IMPRESSION / ASSESSMENT AND PLAN / ED COURSE  Pertinent labs & imaging results that were available during my care of the patient were reviewed by me and considered in my medical decision making (see chart for details).  Review of the [place] CSRS was performed in accordance of the [place] prior to dispensing any controlled drugs.  Clinical Course    Patient's diagnosis is consistent with Suture removal to the right forearm. Area has healed appropriately. No complications..  Patient is given ED precautions to return to the ED for any worsening or new symptoms.     ____________________________________________  FINAL CLINICAL IMPRESSION(S) / ED DIAGNOSES  Final diagnoses:  Visit for suture removal      NEW MEDICATIONS STARTED DURING THIS VISIT:  Current Discharge Medication List          This chart was dictated using voice recognition software/Dragon. Despite best efforts to proofread, errors can occur which can change the meaning. Any change was purely unintentional.    Darletta Moll, PA-C 08/10/16 1711    Joanne Gavel, MD 08/10/16 2347

## 2016-08-30 MED ORDER — ONDANSETRON 4 MG PO TBDP
ORAL_TABLET | ORAL | Status: AC
  Filled 2016-08-30: qty 1

## 2018-06-24 ENCOUNTER — Emergency Department
Admission: EM | Admit: 2018-06-24 | Discharge: 2018-06-24 | Disposition: A | Discharge status: Home / Self Care | Payer: Medicare Other | Attending: Emergency Medicine | Admitting: Emergency Medicine

## 2018-06-24 ENCOUNTER — Other Ambulatory Visit: Payer: Self-pay

## 2018-06-24 ENCOUNTER — Encounter: Payer: Self-pay | Admitting: Emergency Medicine

## 2018-06-24 DIAGNOSIS — Y998 Other external cause status: Secondary | ICD-10-CM | POA: Diagnosis not present

## 2018-06-24 DIAGNOSIS — Z87891 Personal history of nicotine dependence: Secondary | ICD-10-CM | POA: Insufficient documentation

## 2018-06-24 DIAGNOSIS — W25XXXA Contact with sharp glass, initial encounter: Secondary | ICD-10-CM | POA: Diagnosis not present

## 2018-06-24 DIAGNOSIS — S61412A Laceration without foreign body of left hand, initial encounter: Secondary | ICD-10-CM

## 2018-06-24 DIAGNOSIS — Y929 Unspecified place or not applicable: Secondary | ICD-10-CM | POA: Insufficient documentation

## 2018-06-24 DIAGNOSIS — Y939 Activity, unspecified: Secondary | ICD-10-CM | POA: Diagnosis not present

## 2018-06-24 DIAGNOSIS — I10 Essential (primary) hypertension: Secondary | ICD-10-CM | POA: Insufficient documentation

## 2018-06-24 HISTORY — DX: Malignant (primary) neoplasm, unspecified: C80.1

## 2018-06-24 NOTE — ED Notes (Signed)
Pt discharged home after verbalizing understanding of discharge instructions; nad noted. 

## 2018-06-24 NOTE — ED Notes (Signed)
tdap [id number]

## 2018-06-24 NOTE — ED Notes (Signed)
Pt presents with laceration to left hand. He cut it on a mirror today. Pt concerned it has gone deep enough to injure the tendon. Pt alert & oriented with NAD noted.

## 2018-06-24 NOTE — Discharge Instructions (Signed)
You have had your laceration repaired using wound adhesive. Keep the wound clean, dry, and covered. Avoid excessive water exposure. Do not apply ointments, creams, lotions, or oils to the wound.

## 2018-06-24 NOTE — ED Triage Notes (Signed)
Pt comes into the ED via POV c/o laceration to the left hand after a mirror cut him.  All bleeding under control and patient in NAD.

## 2018-06-24 NOTE — ED Provider Notes (Signed)
[facility] Emergency Department Provider Note ____________________________________________  Time seen: 1810  I have reviewed the triage vital signs and the nursing notes.  HISTORY  Chief Complaint  Laceration  HPI Billy Mckenzie is a 72 y.o. male presents himself to the ED for evaluation management of a laceration to his left hand.  Patient describes a cut the dorsal hand after mirror broke and cut him.  He denies any other injury at this time but he was concerned because he got exposure to his tendon.  He admits to normal composite fist and normal gross sensation.  Patient reports his tetanus is current as of 08/2017.  Past Medical History:  Diagnosis Date  . Cancer (HCC)    squamous cell carcinoma  . Hypertension     There are no active problems to display for this patient.   Past Surgical History:  Procedure Laterality Date  . LUNG TRANSPLANT      Prior to Admission medications   Not on File    Allergies Nsaids  No family history on file.  Social History Social History   Tobacco Use  . Smoking status: Former Research scientist (life sciences)  . Smokeless tobacco: Never Used  Substance Use Topics  . Alcohol use: No  . Drug use: Not on file    Review of Systems  Constitutional: Negative for fever. Cardiovascular: Negative for chest pain. Respiratory: Negative for shortness of breath. Musculoskeletal: Negative for back pain. Skin: Negative for rash.  Dorsal left hand laceration as above. Neurological: Negative for headaches, focal weakness or numbness. ____________________________________________  PHYSICAL EXAM:  VITAL SIGNS: ED Triage Vitals  Enc Vitals Group     BP 06/24/18 1745 (!) 141/59     Pulse Rate 06/24/18 1745 60     Resp 06/24/18 1745 16     Temp 06/24/18 1745 98.2 F (36.8 C)     Temp Source 06/24/18 1745 Oral     SpO2 06/24/18 1745 99 %     Weight 06/24/18 1744 220 lb (99.8 kg)     Height 06/24/18 1744 5\' 11"  (1.803 m)     Head  Circumference --      Peak Flow --      Pain Score 06/24/18 1744 0     Pain Loc --      Pain Edu? --      Excl. in [place]? --     Constitutional: Alert and oriented. Well appearing and in no distress. Head: Normocephalic and atraumatic. Cardiovascular: Normal rate, regular rhythm. Normal distal pulses. Respiratory: Normal respiratory effort.  Musculoskeletal: Left hand with dorsal laceration over the index finger metacarpal.  Normal composite fist.  No active bleeding is appreciated.  Superficial laceration without any extension into the subcu.  No tendon injury is suspected.  Nontender with normal range of motion in all extremities.  Neurologic:  Normal.  Normal speech and language. No gross focal neurologic deficits are appreciated. Skin:  Skin is warm, dry and intact. No rash noted. ____________________________________________  PROCEDURES  .Marland KitchenLaceration Repair Date/Time: 06/24/2018 6:17 PM Performed by: Melvenia Needles, PA-C Authorized by: Melvenia Needles, PA-C   Consent:    Consent obtained:  Verbal   Consent given by:  Patient   Risks discussed:  Infection Anesthesia (see MAR for exact dosages):    Anesthesia method:  None Laceration details:    Location:  Hand   Hand location:  L hand, dorsum   Length (cm):  2.5 Repair type:    Repair  type:  Simple Pre-procedure details:    Preparation:  Patient was prepped and draped in usual sterile fashion Treatment:    Area cleansed with:  Saline   Amount of cleaning:  Standard Skin repair:    Repair method:  Tissue adhesive Approximation:    Approximation:  Close Post-procedure details:    Dressing:  Non-adherent dressing   Patient tolerance of procedure:  Tolerated well, no immediate complications  ____________________________________________  INITIAL IMPRESSION / ASSESSMENT AND PLAN / ED COURSE  Patient with ED evaluation of an axonal laceration to the dorsal left hand.  Patient superficial wound is repaired  using wound adhesive.  Patient is discharged with wound care instructions.  He will follow-up with primary provider return to the ED as needed. ____________________________________________  FINAL CLINICAL IMPRESSION(S) / ED DIAGNOSES  Final diagnoses:  Laceration of left hand without foreign body, initial encounter      Melvenia Needles, PA-C 06/24/18 1820    Carrie Mew, MD 06/24/18 2046

## 2019-05-22 DEATH — deceased
